# Patient Record
Sex: Male | Born: 1985 | Race: White | Hispanic: No | Marital: Single | Smoking: Current every day smoker
Health system: Southern US, Community
[De-identification: ages and names within clinical notes are randomized; demographics above are authoritative.]

## PROBLEM LIST (undated history)

## (undated) DIAGNOSIS — S5290XA Unspecified fracture of unspecified forearm, initial encounter for closed fracture: Secondary | ICD-10-CM

## (undated) DIAGNOSIS — S62631A Displaced fracture of distal phalanx of left index finger, initial encounter for closed fracture: Secondary | ICD-10-CM

## (undated) DIAGNOSIS — M5430 Sciatica, unspecified side: Secondary | ICD-10-CM

## (undated) DIAGNOSIS — R21 Rash and other nonspecific skin eruption: Secondary | ICD-10-CM

---
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
## 2010-01-10 ENCOUNTER — Encounter: Admission: RE | Admit: 2010-01-10 | Discharge: 2010-01-10 | Payer: Self-pay | Admitting: Orthopaedic Surgery

## 2010-12-11 ENCOUNTER — Emergency Department (HOSPITAL_COMMUNITY)
Admission: EM | Admit: 2010-12-11 | Discharge: 2010-12-11 | Disposition: A | Discharge status: Home / Self Care | Payer: Self-pay | Attending: Emergency Medicine | Admitting: Emergency Medicine

## 2010-12-11 DIAGNOSIS — S335XXA Sprain of ligaments of lumbar spine, initial encounter: Secondary | ICD-10-CM | POA: Insufficient documentation

## 2010-12-11 DIAGNOSIS — X500XXA Overexertion from strenuous movement or load, initial encounter: Secondary | ICD-10-CM | POA: Insufficient documentation

## 2010-12-11 DIAGNOSIS — M545 Low back pain, unspecified: Secondary | ICD-10-CM | POA: Insufficient documentation

## 2015-06-09 ENCOUNTER — Encounter (HOSPITAL_COMMUNITY): Payer: Self-pay

## 2015-06-09 ENCOUNTER — Emergency Department (HOSPITAL_COMMUNITY)
Admission: EM | Admit: 2015-06-09 | Discharge: 2015-06-09 | Disposition: A | Discharge status: Home / Self Care | Payer: Self-pay | Attending: Emergency Medicine | Admitting: Emergency Medicine

## 2015-06-09 DIAGNOSIS — Z720 Tobacco use: Secondary | ICD-10-CM | POA: Insufficient documentation

## 2015-06-09 DIAGNOSIS — M5416 Radiculopathy, lumbar region: Secondary | ICD-10-CM | POA: Insufficient documentation

## 2015-06-09 HISTORY — DX: Sciatica, unspecified side: M54.30

## 2015-06-09 MED ORDER — CYCLOBENZAPRINE HCL 5 MG PO TABS: 5.0000 mg | ORAL_TABLET | Freq: Three times a day (TID) | ORAL | Status: DC | PRN

## 2015-06-09 MED ORDER — PREDNISONE 10 MG PO TABS: ORAL_TABLET | ORAL | Status: DC

## 2015-06-09 NOTE — ED Notes (Signed)
Discharge instructions given to pt -- reviewed presriptions and the possible side effects of sleepiness . Verbalized understanding , Ambulated off unit independently

## 2015-06-09 NOTE — ED Notes (Signed)
Pt reports has history of sciatica.  Reports pain in r lower back radiating down r leg after cutting down a tree Saturday.

## 2015-06-09 NOTE — Discharge Instructions (Signed)
Lumbosacral Radiculopathy Lumbosacral radiculopathy is a pinched nerve or nerves in the low back (lumbosacral area). When this happens you may have weakness in your legs and may not be able to stand on your toes. You may have pain going down into your legs. There may be difficulties with walking normally. There are many causes of this problem. Sometimes this may happen from an injury, or simply from arthritis or boney problems. It may also be caused by other illnesses such as diabetes. If there is no improvement after treatment, further studies may be done to find the exact cause. DIAGNOSIS  X-rays may be needed if the problems become long standing. Electromyograms may be done. This study is one in which the working of nerves and muscles is studied. HOME CARE INSTRUCTIONS   Applications of ice packs may be helpful. Ice can be used in a plastic bag with a towel around it to prevent frostbite to skin. This may be used every 2 hours for 20 to 30 minutes, or as needed, while awake, or as directed by your caregiver.  Only take over-the-counter or prescription medicines for pain, discomfort, or fever as directed by your caregiver.  If physical therapy was prescribed, follow your caregiver's directions. SEEK IMMEDIATE MEDICAL CARE IF:   You have pain not controlled with medications.  You seem to be getting worse rather than better.  You develop increasing weakness in your legs.  You develop loss of bowel or bladder control.  You have difficulty with walking or balance, or develop clumsiness in the use of your legs.  You have a fever. MAKE SURE YOU:   Understand these instructions.  Will watch your condition.  Will get help right away if you are not doing well or get worse. Document Released: 08/28/2005 Document Revised: 11/20/2011 Document Reviewed: 04/17/2008 [facility] Patient Information 2015 [place], [place]. This information is not intended to replace advice given to you by your health  care provider. Make sure you discuss any questions you have with your health care provider.    Avoid lifting,  Bending,  Twisting or any other activity that worsens your pain over the next week.  Apply a heating pad to your lower back for 20 minutes three times daily.  You should get rechecked if your symptoms are not better over the next 5 days,  Or you develop increased pain,  Weakness in your leg(s) or loss of bladder or bowel function - these are symptoms of a worsening problem.

## 2015-06-10 NOTE — ED Provider Notes (Signed)
CSN: [id number]     Arrival date & time 06/09/15  1037 History   First MD Initiated Contact with Patient 06/09/15 1052     Chief Complaint  Patient presents with  . Back Pain     (Consider location/radiation/quality/duration/timing/severity/associated sxs/prior Treatment) The history is provided by the patient.   Luke Boone is a 29 y.o. male presenting with acute on chronic low back pain which has which has been present for the past 2 days since he assisted at home cleaning up a tree which was cut several days ago.  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  He was receiving steroid injections by Dr. Cleophas Dunker but stopped going when this tx was not working and surgery was suggested.  He reports radiation of pain into his right posterior thigh.  There has been no weakness or numbness in the lower extremities and no urinary or bowel retention or incontinence.  Patient does not have a history of cancer or IVDU.  The patient has tried rest ice and tylenol without significant relief of symptoms.     Past Medical History  Diagnosis Date  . Sciatica    History reviewed. No pertinent past surgical history. No family history on file. Social History  Substance Use Topics  . Smoking status: Current Every Day Smoker  . Smokeless tobacco: None  . Alcohol Use: No    Review of Systems  Constitutional: Negative for fever.  Respiratory: Negative for shortness of breath.   Cardiovascular: Negative for chest pain and leg swelling.  Gastrointestinal: Negative for abdominal pain, constipation and abdominal distention.  Genitourinary: Negative for dysuria, urgency, frequency, flank pain and difficulty urinating.  Musculoskeletal: Positive for back pain. Negative for joint swelling and gait problem.  Skin: Negative for rash.  Neurological: Negative for weakness and numbness.      Allergies  Review of patient's allergies indicates no known  allergies.  Home Medications   Prior to Admission medications   Medication Sig Start Date End Date Taking? Authorizing Provider  cyclobenzaprine (FLEXERIL) 5 MG tablet Take 1 tablet (5 mg total) by mouth 3 (three) times daily as needed for muscle spasms. 06/09/15   Burgess Amor, PA-C  predniSONE (DELTASONE) 10 MG tablet 6, 5, 4, 3, 2 then 1 tablet by mouth daily for 6 days total. 06/09/15   Burgess Amor, PA-C   BP 154/99 mmHg  Pulse 91  Temp(Src) 97.8 F (36.6 C) (Oral)  Resp 18  Ht  (1.778 m)  Wt 130 lb (58.968 kg)  BMI 18.65 kg/m2  SpO2 100% Physical Exam  Constitutional: He appears well-developed and well-nourished.  HENT:  Head: Normocephalic.  Eyes: Conjunctivae are normal.  Neck: Normal range of motion. Neck supple.  Cardiovascular: Normal rate and intact distal pulses.   Pedal pulses normal.  Pulmonary/Chest: Effort normal.  Abdominal: Soft. Bowel sounds are normal. He exhibits no distension and no mass.  Musculoskeletal: Normal range of motion. He exhibits no edema.       Lumbar back: He exhibits tenderness. He exhibits no swelling, no edema and no spasm.  Right paralumbar ttp.   Neurological: He is alert. He has normal strength. He displays no atrophy and no tremor. No sensory deficit. Gait normal.  Reflex Scores:      Patellar reflexes are 2+ on the right side and 2+ on the left side.      Achilles reflexes are 2+ on the right side and 2+ on the  left side. No strength deficit noted in hip and knee flexor and extensor muscle groups.  Ankle flexion and extension intact.  Skin: Skin is warm and dry.  Psychiatric: He has a normal mood and affect.  Nursing note and vitals reviewed.   ED Course  Procedures (including critical care time) Labs Review Labs Reviewed - No data to display  Imaging Review No results found. I have personally reviewed and evaluated these images and lab results as part of my medical decision-making.   EKG Interpretation None      MDM    Final diagnoses:  Lumbar radiculopathy, acute    No neuro deficit on exam or by history to suggest emergent or surgical presentation with no indication for imaging.  Discussed worsened sx that should prompt immediate re-evaluation including distal weakness, bowel/bladder retention/incontinence.  He was placed on prednisone taper, and flexeril, advised heat tx alternating with ice.  Activity as tolerated, f/u with orthopedic specialist if sx persist or worsen.          Burgess Amor, PA-C 06/10/15 1610  Blane Ohara, MD 06/15/15 [phone]

## 2016-07-16 ENCOUNTER — Encounter (HOSPITAL_COMMUNITY): Payer: Self-pay

## 2016-07-16 ENCOUNTER — Ambulatory Visit (HOSPITAL_COMMUNITY)
Admission: EM | Admit: 2016-07-16 | Discharge: 2016-07-16 | Disposition: A | Discharge status: Home / Self Care | Payer: Self-pay | Attending: Emergency Medicine | Admitting: Emergency Medicine

## 2016-07-16 ENCOUNTER — Ambulatory Visit (INDEPENDENT_AMBULATORY_CARE_PROVIDER_SITE_OTHER): Payer: Self-pay

## 2016-07-16 DIAGNOSIS — S42021A Displaced fracture of shaft of right clavicle, initial encounter for closed fracture: Secondary | ICD-10-CM

## 2016-07-16 MED ORDER — HYDROCODONE-ACETAMINOPHEN 7.5-325 MG PO TABS: 1.0000 | ORAL_TABLET | ORAL | 0 refills | Status: DC | PRN
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
## 2016-07-16 NOTE — Discharge Instructions (Signed)
Ice, wear the arm sling until you see the orthopedist. Call the orthopedist tomorrow for an appointment. Decrease her activity. If you develop shortness of breath or cough, weakness, faintness, passing out or other problems call 911 or go to the emergency department promptly.

## 2017-03-07 ENCOUNTER — Emergency Department (HOSPITAL_COMMUNITY): Payer: Self-pay

## 2017-03-07 ENCOUNTER — Encounter (HOSPITAL_COMMUNITY): Payer: Self-pay | Admitting: Family Medicine

## 2017-03-07 ENCOUNTER — Emergency Department (HOSPITAL_COMMUNITY)
Admission: EM | Admit: 2017-03-07 | Discharge: 2017-03-08 | Disposition: A | Discharge status: Home / Self Care | Payer: Self-pay | Attending: Emergency Medicine | Admitting: Emergency Medicine

## 2017-03-07 DIAGNOSIS — Y92410 Unspecified street and highway as the place of occurrence of the external cause: Secondary | ICD-10-CM | POA: Insufficient documentation

## 2017-03-07 DIAGNOSIS — Y9389 Activity, other specified: Secondary | ICD-10-CM | POA: Insufficient documentation

## 2017-03-07 DIAGNOSIS — S62631A Displaced fracture of distal phalanx of left index finger, initial encounter for closed fracture: Secondary | ICD-10-CM | POA: Insufficient documentation

## 2017-03-07 DIAGNOSIS — Z79899 Other long term (current) drug therapy: Secondary | ICD-10-CM | POA: Insufficient documentation

## 2017-03-07 DIAGNOSIS — S52614A Nondisplaced fracture of right ulna styloid process, initial encounter for closed fracture: Secondary | ICD-10-CM | POA: Insufficient documentation

## 2017-03-07 DIAGNOSIS — Y999 Unspecified external cause status: Secondary | ICD-10-CM | POA: Insufficient documentation

## 2017-03-07 DIAGNOSIS — F17210 Nicotine dependence, cigarettes, uncomplicated: Secondary | ICD-10-CM | POA: Insufficient documentation

## 2017-03-07 DIAGNOSIS — S0181XA Laceration without foreign body of other part of head, initial encounter: Secondary | ICD-10-CM | POA: Insufficient documentation

## 2017-03-07 DIAGNOSIS — S52571A Other intraarticular fracture of lower end of right radius, initial encounter for closed fracture: Secondary | ICD-10-CM | POA: Insufficient documentation

## 2017-03-07 DIAGNOSIS — S5290XA Unspecified fracture of unspecified forearm, initial encounter for closed fracture: Secondary | ICD-10-CM

## 2017-03-07 DIAGNOSIS — S30810A Abrasion of lower back and pelvis, initial encounter: Secondary | ICD-10-CM | POA: Insufficient documentation

## 2017-03-07 DIAGNOSIS — S62611A Displaced fracture of proximal phalanx of left index finger, initial encounter for closed fracture: Secondary | ICD-10-CM | POA: Diagnosis present

## 2017-03-07 DIAGNOSIS — Z23 Encounter for immunization: Secondary | ICD-10-CM | POA: Insufficient documentation

## 2017-03-07 HISTORY — DX: Unspecified fracture of unspecified forearm, initial encounter for closed fracture: S52.90XA

## 2017-03-07 MED ORDER — LIDOCAINE HCL (PF) 1 % IJ SOLN
5.0000 mL | Freq: Once | INTRAMUSCULAR | Status: AC
  Administered 2017-03-07: 5 mL
  Filled 2017-03-07: qty 30

## 2017-03-07 MED ORDER — BACITRACIN ZINC 500 UNIT/GM EX OINT
TOPICAL_OINTMENT | Freq: Two times a day (BID) | CUTANEOUS | Status: DC
  Administered 2017-03-07: 3 via TOPICAL
  Filled 2017-03-07: qty 2.7

## 2017-03-07 MED ORDER — TETANUS-DIPHTH-ACELL PERTUSSIS 5-2.5-18.5 LF-MCG/0.5 IM SUSP
0.5000 mL | Freq: Once | INTRAMUSCULAR | Status: AC
  Administered 2017-03-07: 0.5 mL via INTRAMUSCULAR
  Filled 2017-03-07: qty 0.5

## 2017-03-07 MED ORDER — HYDROCODONE-ACETAMINOPHEN 5-325 MG PO TABS
1.0000 | ORAL_TABLET | Freq: Once | ORAL | Status: AC
  Administered 2017-03-07: 1 via ORAL
  Filled 2017-03-07: qty 1
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
## 2017-03-08 ENCOUNTER — Other Ambulatory Visit (INDEPENDENT_AMBULATORY_CARE_PROVIDER_SITE_OTHER): Payer: Self-pay | Admitting: Orthopaedic Surgery

## 2017-03-08 ENCOUNTER — Ambulatory Visit (INDEPENDENT_AMBULATORY_CARE_PROVIDER_SITE_OTHER): Payer: Self-pay | Admitting: Orthopaedic Surgery

## 2017-03-08 DIAGNOSIS — S62611A Displaced fracture of proximal phalanx of left index finger, initial encounter for closed fracture: Secondary | ICD-10-CM

## 2017-03-08 DIAGNOSIS — S52531A Colles' fracture of right radius, initial encounter for closed fracture: Secondary | ICD-10-CM

## 2017-03-08 MED ORDER — HYDROCODONE-ACETAMINOPHEN 5-325 MG PO TABS: 1.0000 | ORAL_TABLET | Freq: Four times a day (QID) | ORAL | 0 refills | Status: DC | PRN

## 2017-03-08 MED ORDER — METHOCARBAMOL 500 MG PO TABS: 500.0000 mg | ORAL_TABLET | Freq: Every evening | ORAL | 0 refills | Status: DC | PRN

## 2017-03-08 MED ORDER — NAPROXEN 500 MG PO TABS: 500.0000 mg | ORAL_TABLET | Freq: Two times a day (BID) | ORAL | 0 refills | Status: DC
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
## 2017-03-08 NOTE — Progress Notes (Signed)
Orthopedic Tech Progress Note Patient Details:  Luke Boone 11/13/1985 [id number]  Ortho Devices Type of Ortho Device: Arm sling, Sugartong splint, Finger splint Ortho Device/Splint Location: lue index finger splint. rue sugartong. Ortho Device/Splint Interventions: Ordered, Application, Adjustment   Luke Boone, Luke Boone 03/08/2017, 1:31 AM

## 2017-03-08 NOTE — ED Notes (Signed)
Ortho tech at bedside 
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
## 2017-03-12 ENCOUNTER — Other Ambulatory Visit (INDEPENDENT_AMBULATORY_CARE_PROVIDER_SITE_OTHER): Payer: Self-pay | Admitting: Orthopaedic Surgery

## 2017-03-12 DIAGNOSIS — S52501A Unspecified fracture of the lower end of right radius, initial encounter for closed fracture: Secondary | ICD-10-CM

## 2017-03-13 ENCOUNTER — Encounter (HOSPITAL_COMMUNITY): Payer: Self-pay

## 2017-03-13 ENCOUNTER — Encounter (HOSPITAL_COMMUNITY)
Admission: RE | Admit: 2017-03-13 | Discharge: 2017-03-13 | Disposition: A | Discharge status: Home / Self Care | Payer: Self-pay | Source: Ambulatory Visit | Attending: Orthopaedic Surgery | Admitting: Orthopaedic Surgery

## 2017-03-13 DIAGNOSIS — S52501A Unspecified fracture of the lower end of right radius, initial encounter for closed fracture: Secondary | ICD-10-CM | POA: Insufficient documentation

## 2017-03-13 DIAGNOSIS — X58XXXA Exposure to other specified factors, initial encounter: Secondary | ICD-10-CM | POA: Insufficient documentation

## 2017-03-13 DIAGNOSIS — S62611A Displaced fracture of proximal phalanx of left index finger, initial encounter for closed fracture: Secondary | ICD-10-CM | POA: Insufficient documentation

## 2017-03-13 DIAGNOSIS — Z01812 Encounter for preprocedural laboratory examination: Secondary | ICD-10-CM | POA: Insufficient documentation

## 2017-03-13 HISTORY — DX: Displaced fracture of distal phalanx of left index finger, initial encounter for closed fracture: S62.631A

## 2017-03-13 HISTORY — DX: Rash and other nonspecific skin eruption: R21

## 2017-03-13 HISTORY — DX: Unspecified fracture of unspecified forearm, initial encounter for closed fracture: S52.90XA

## 2017-03-13 LAB — CBC
HCT: 42.1 % (ref 39.0–52.0)
Hemoglobin: 14.3 g/dL (ref 13.0–17.0)
MCH: 30.7 pg (ref 26.0–34.0)
MCHC: 34 g/dL (ref 30.0–36.0)
MCV: 90.3 fL (ref 78.0–100.0)
Platelets: 267 10*3/uL (ref 150–400)
RBC: 4.66 MIL/uL (ref 4.22–5.81)
RDW: 12.7 % (ref 11.5–15.5)
WBC: 12.8 10*3/uL — AB (ref 4.0–10.5)

## 2017-03-13 NOTE — Patient Instructions (Addendum)
Sharren BridgeCory D Kohles  03/13/2017   Your procedure is scheduled on: 03-16-17  [facility]  Entrance [facility] [place]  elevators to 3rd floor to  Short Stay Center at 715  AM.  Call this number if you have problems the morning of surgery [phone]    Remember: ONLY 1 PERSON MAY GO WITH YOU TO SHORT STAY TO GET  READY MORNING OF YOUR SURGERY.  Do not eat food or drink liquids :After Midnight.     Take these medicines the morning of surgery with A SIP OF WATER: hydrocodone if needed                               You may not have any metal on your body including hair pins and              piercings  Do not wear jewelry, make-up, lotions, powders or perfumes, deodorant             Do not wear nail polish.  Do not shave  48 hours prior to surgery.              Men may shave face and neck.   Do not bring valuables to the hospital. [facility] IS NOT             RESPONSIBLE   FOR VALUABLES.  Contacts, dentures or bridgework may not be worn into surgery.  Leave suitcase in the car. After surgery it may be brought to your room.                  Please read over the following fact sheets you were given: _____________________________________________________________________             [facility] - Preparing for [facility] Before surgery, you can play an important role.  Because skin is not sterile, your skin needs to be as free of germs as possible.  You can reduce the number of germs on your skin by washing with CHG (chlorahexidine gluconate) soap before surgery.  CHG is an antiseptic cleaner which kills germs and bonds with the skin to continue killing germs even after washing. Please DO NOT use if you have an allergy to CHG or antibacterial soaps.  If your skin becomes reddened/irritated stop using the CHG and inform your nurse when you arrive at Short Stay. Do not shave (including legs and underarms) for at least 48 hours prior to the first CHG shower.  You may  shave your face/neck. Please follow these instructions carefully:  1.  Shower with CHG Soap the night before surgery and the  morning of Surgery.  2.  If you choose to wash your hair, wash your hair first as usual with your  normal  shampoo.  3.  After you shampoo, rinse your hair and body thoroughly to remove the  shampoo.                           4.  Use CHG as you would any other liquid soap.  You can apply chg directly  to the skin and wash                       Gently with a scrungie or clean washcloth.  5.  Apply  the CHG Soap to your body ONLY FROM THE NECK DOWN.   Do not use on face/ open                           Wound or open sores. Avoid contact with eyes, ears mouth and genitals (private parts).                       Wash face,  Genitals (private parts) with your normal soap.             6.  Wash thoroughly, paying special attention to the area where your surgery  will be performed.  7.  Thoroughly rinse your body with warm water from the neck down.  8.  DO NOT shower/wash with your normal soap after using and rinsing off  the CHG Soap.                9.  Pat yourself dry with a clean towel.            10.  Wear clean pajamas.            11.  Place clean sheets on your bed the night of your first shower and do not  sleep with pets. Day of Surgery : Do not apply any lotions/deodorants the morning of surgery.  Please wear clean clothes to the hospital/surgery center.  FAILURE TO FOLLOW THESE INSTRUCTIONS MAY RESULT IN THE CANCELLATION OF YOUR SURGERY PATIENT SIGNATURE_________________________________  NURSE SIGNATURE__________________________________  ________________________________________________________________________
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
## 2017-03-16 ENCOUNTER — Ambulatory Visit (HOSPITAL_COMMUNITY)
Admission: RE | Admit: 2017-03-16 | Discharge: 2017-03-16 | Disposition: A | Discharge status: Home / Self Care | Payer: Self-pay | Source: Ambulatory Visit | Attending: Orthopaedic Surgery | Admitting: Orthopaedic Surgery

## 2017-03-16 ENCOUNTER — Ambulatory Visit (HOSPITAL_COMMUNITY): Payer: Self-pay | Admitting: Anesthesiology

## 2017-03-16 ENCOUNTER — Encounter (HOSPITAL_COMMUNITY): Payer: Self-pay | Admitting: Anesthesiology

## 2017-03-16 ENCOUNTER — Encounter (HOSPITAL_COMMUNITY)
Admission: RE | Disposition: A | Discharge status: Home / Self Care | Payer: Self-pay | Source: Ambulatory Visit | Attending: Orthopaedic Surgery

## 2017-03-16 DIAGNOSIS — S62611A Displaced fracture of proximal phalanx of left index finger, initial encounter for closed fracture: Secondary | ICD-10-CM | POA: Diagnosis present

## 2017-03-16 DIAGNOSIS — S52531A Colles' fracture of right radius, initial encounter for closed fracture: Secondary | ICD-10-CM

## 2017-03-16 DIAGNOSIS — F17210 Nicotine dependence, cigarettes, uncomplicated: Secondary | ICD-10-CM | POA: Insufficient documentation

## 2017-03-16 DIAGNOSIS — S52571A Other intraarticular fracture of lower end of right radius, initial encounter for closed fracture: Secondary | ICD-10-CM | POA: Insufficient documentation

## 2017-03-16 DIAGNOSIS — S52501A Unspecified fracture of the lower end of right radius, initial encounter for closed fracture: Secondary | ICD-10-CM

## 2017-03-16 HISTORY — PX: OPEN REDUCTION INTERNAL FIXATION (ORIF) DISTAL RADIAL FRACTURE: SHX5989

## 2017-03-16 SURGERY — OPEN REDUCTION INTERNAL FIXATION (ORIF) DISTAL RADIUS FRACTURE
Anesthesia: Monitor Anesthesia Care | Site: Wrist | Laterality: Right

## 2017-03-16 MED ORDER — PROPOFOL 10 MG/ML IV BOLUS
INTRAVENOUS | Status: AC
  Filled 2017-03-16: qty 20

## 2017-03-16 MED ORDER — ROPIVACAINE HCL 5 MG/ML IJ SOLN
INTRAMUSCULAR | Status: DC | PRN
  Administered 2017-03-16: 5 mL via PERINEURAL

## 2017-03-16 MED ORDER — BACITRACIN-NEOMYCIN-POLYMYXIN 400-5-5000 EX OINT
TOPICAL_OINTMENT | CUTANEOUS | Status: AC
  Filled 2017-03-16: qty 1

## 2017-03-16 MED ORDER — PROPOFOL 10 MG/ML IV BOLUS
INTRAVENOUS | Status: DC | PRN
  Administered 2017-03-16 (×2): 20 mg via INTRAVENOUS
  Administered 2017-03-16: 40 mg via INTRAVENOUS

## 2017-03-16 MED ORDER — SODIUM CHLORIDE 0.9 % IV SOLN
INTRAVENOUS | Status: DC | PRN
  Administered 2017-03-16: 500 mL

## 2017-03-16 MED ORDER — OXYCODONE-ACETAMINOPHEN 5-325 MG PO TABS: 1.0000 | ORAL_TABLET | ORAL | 0 refills | Status: DC | PRN

## 2017-03-16 MED ORDER — PHENYLEPHRINE HCL 10 MG/ML IJ SOLN
INTRAMUSCULAR | Status: DC | PRN
  Administered 2017-03-16 (×2): 40 ug via INTRAVENOUS

## 2017-03-16 MED ORDER — SODIUM CHLORIDE 0.9 % IV SOLN
INTRAVENOUS | Status: AC
  Filled 2017-03-16: qty 500000

## 2017-03-16 MED ORDER — FENTANYL CITRATE (PF) 250 MCG/5ML IJ SOLN
INTRAMUSCULAR | Status: AC
  Filled 2017-03-16: qty 5

## 2017-03-16 MED ORDER — FENTANYL CITRATE (PF) 100 MCG/2ML IJ SOLN: 25.0000 ug | INTRAMUSCULAR | Status: DC | PRN

## 2017-03-16 MED ORDER — ONDANSETRON HCL 4 MG/2ML IJ SOLN
INTRAMUSCULAR | Status: DC | PRN
  Administered 2017-03-16: 4 mg via INTRAVENOUS

## 2017-03-16 MED ORDER — PROPOFOL 10 MG/ML IV BOLUS
INTRAVENOUS | Status: AC
  Filled 2017-03-16: qty 40

## 2017-03-16 MED ORDER — FENTANYL CITRATE (PF) 100 MCG/2ML IJ SOLN
INTRAMUSCULAR | Status: DC | PRN
Start: 2017-03-16 — End: 2017-03-16
  Administered 2017-03-16 (×3): 25 ug via INTRAVENOUS
  Administered 2017-03-16: 50 ug via INTRAVENOUS

## 2017-03-16 MED ORDER — BACITRACIN-NEOMYCIN-POLYMYXIN 400-5-5000 EX OINT
TOPICAL_OINTMENT | CUTANEOUS | Status: DC | PRN
  Administered 2017-03-16: 1 via TOPICAL

## 2017-03-16 MED ORDER — BUPIVACAINE-EPINEPHRINE (PF) 0.5% -1:200000 IJ SOLN
INTRAMUSCULAR | Status: DC | PRN
  Administered 2017-03-16: 20 mL via PERINEURAL

## 2017-03-16 MED ORDER — ONDANSETRON HCL 4 MG/2ML IJ SOLN
INTRAMUSCULAR | Status: AC
  Filled 2017-03-16: qty 2

## 2017-03-16 MED ORDER — CEFAZOLIN SODIUM-DEXTROSE 2-4 GM/100ML-% IV SOLN
2.0000 g | INTRAVENOUS | Status: AC
  Administered 2017-03-16: 2 g via INTRAVENOUS
  Filled 2017-03-16: qty 100

## 2017-03-16 MED ORDER — ACETAMINOPHEN 325 MG PO TABS: 325.0000 mg | ORAL_TABLET | ORAL | Status: DC | PRN

## 2017-03-16 MED ORDER — MIDAZOLAM HCL 2 MG/2ML IJ SOLN
INTRAMUSCULAR | Status: AC
  Filled 2017-03-16: qty 2

## 2017-03-16 MED ORDER — PROPOFOL 500 MG/50ML IV EMUL
INTRAVENOUS | Status: DC | PRN
  Administered 2017-03-16: 100 ug/kg/min via INTRAVENOUS

## 2017-03-16 MED ORDER — LACTATED RINGERS IV SOLN
INTRAVENOUS | Status: DC
  Administered 2017-03-16: 08:00:00 via INTRAVENOUS

## 2017-03-16 MED ORDER — PHENYLEPHRINE 40 MCG/ML (10ML) SYRINGE FOR IV PUSH (FOR BLOOD PRESSURE SUPPORT)
PREFILLED_SYRINGE | INTRAVENOUS | Status: AC
  Filled 2017-03-16: qty 10

## 2017-03-16 MED ORDER — MIDAZOLAM HCL 5 MG/5ML IJ SOLN
INTRAMUSCULAR | Status: DC | PRN
Start: 2017-03-16 — End: 2017-03-16
  Administered 2017-03-16: 2 mg via INTRAVENOUS

## 2017-03-16 MED ORDER — ACETAMINOPHEN 160 MG/5ML PO SOLN: 325.0000 mg | ORAL | Status: DC | PRN

## 2017-03-16 SURGICAL SUPPLY — 71 items
BAG SPEC THK2 15X12 ZIP CLS (MISCELLANEOUS) ×2
BAG ZIPLOCK 12X15 (MISCELLANEOUS) ×4 IMPLANT
BANDAGE ACE 4X5 VEL STRL LF (GAUZE/BANDAGES/DRESSINGS) ×4 IMPLANT
BIT DRILL 2.2 SS TIBIAL (BIT) ×3 IMPLANT
COVER SURGICAL LIGHT HANDLE (MISCELLANEOUS) ×4 IMPLANT
CUFF TOURN SGL QUICK 18 (TOURNIQUET CUFF) ×4 IMPLANT
DRAPE C-ARM 42X120 X-RAY (DRAPES) ×4 IMPLANT
DRAPE OEC MINIVIEW 54X84 (DRAPES) ×4 IMPLANT
DRAPE SHEET LG 3/4 BI-LAMINATE (DRAPES) ×4 IMPLANT
DRAPE SURG 17X11 SM STRL (DRAPES) ×4 IMPLANT
DRAPE U-SHAPE 47X51 STRL (DRAPES) ×4 IMPLANT
DRSG PAD ABDOMINAL 8X10 ST (GAUZE/BANDAGES/DRESSINGS) ×4 IMPLANT
DURAPREP 26ML APPLICATOR (WOUND CARE) ×4 IMPLANT
ELECT REM PT RETURN 15FT ADLT (MISCELLANEOUS) ×4 IMPLANT
GAUZE SPONGE 4X4 12PLY STRL (GAUZE/BANDAGES/DRESSINGS) ×4 IMPLANT
GAUZE XEROFORM 1X8 LF (GAUZE/BANDAGES/DRESSINGS) ×3 IMPLANT
GLOVE BIO SURGEON STRL SZ7.5 (GLOVE) ×4 IMPLANT
GLOVE BIOGEL PI IND STRL 7.5 (GLOVE) ×3 IMPLANT
GLOVE BIOGEL PI IND STRL 8 (GLOVE) ×6 IMPLANT
GLOVE BIOGEL PI INDICATOR 7.5 (GLOVE) ×6
GLOVE BIOGEL PI INDICATOR 8 (GLOVE) ×6
GLOVE ECLIPSE 8.0 STRL XLNG CF (GLOVE) ×7 IMPLANT
GLOVE ECLIPSE 8.5 STRL (GLOVE) ×3 IMPLANT
GLOVE ORTHO TXT STRL SZ7.5 (GLOVE) ×4 IMPLANT
GLOVE SURG SS PI 7.5 STRL IVOR (GLOVE) ×3 IMPLANT
GOWN STRL REUS W/ TWL XL LVL3 (GOWN DISPOSABLE) ×3 IMPLANT
GOWN STRL REUS W/TWL XL LVL3 (GOWN DISPOSABLE) ×12 IMPLANT
K-WIRE 1.6 (WIRE) ×12
K-WIRE CAPS BLUE STERILE .035 (WIRE)
K-WIRE CAPS STERILE WHITE .045 (WIRE) IMPLANT
K-WIRE FX5X1.6XNS BN SS (WIRE) ×6
KIT BASIN OR (CUSTOM PROCEDURE TRAY) ×4 IMPLANT
KWIRE 4.0 X .045IN (WIRE) IMPLANT
KWIRE CAPS BLUE STRL .035 (WIRE) IMPLANT
KWIRE FX5X1.6XNS BN SS (WIRE) ×3 IMPLANT
MANIFOLD NEPTUNE II (INSTRUMENTS) ×4 IMPLANT
NEEDLE HYPO 22GX1.5 SAFETY (NEEDLE) ×3 IMPLANT
PACK ORTHO EXTREMITY (CUSTOM PROCEDURE TRAY) ×4 IMPLANT
PAD ABD 8X10 STRL (GAUZE/BANDAGES/DRESSINGS) ×3 IMPLANT
PAD CAST 3X4 CTTN HI CHSV (CAST SUPPLIES) ×2 IMPLANT
PAD CAST 4YDX4 CTTN HI CHSV (CAST SUPPLIES) ×2 IMPLANT
PADDING CAST COTTON 3X4 STRL (CAST SUPPLIES) ×4
PADDING CAST COTTON 4X4 STRL (CAST SUPPLIES) ×4
PEG LOCKING SMOOTH 2.2X12 (Peg) ×3 IMPLANT
PEG LOCKING SMOOTH 2.2X18 (Peg) ×3 IMPLANT
PEG LOCKING SMOOTH 2.2X20 (Screw) ×3 IMPLANT
PLATE NARROW DVR RIGHT (Plate) ×3 IMPLANT
POSITIONER SURGICAL ARM (MISCELLANEOUS) ×4 IMPLANT
SCREW LOCK 14X2.7X 3 LD TPR (Screw) ×2 IMPLANT
SCREW LOCK 20X2.7X 3 LD TPR (Screw) ×1 IMPLANT
SCREW LOCKING 2.7X14 (Screw) ×8 IMPLANT
SCREW LOCKING 2.7X15MM (Screw) ×6 IMPLANT
SCREW LOCKING 2.7X20MM (Screw) ×4 IMPLANT
SCREW MULTI DIRECTIONAL 2.7X18 (Screw) ×3 IMPLANT
SCREW MULTI DIRECTIONAL 2.7X20 (Screw) ×3 IMPLANT
SCREW NONLOCK 2.7X20MM (Screw) ×3 IMPLANT
SOL PREP POV-IOD 4OZ 10% (MISCELLANEOUS) ×4 IMPLANT
SUCTION FRAZIER HANDLE 10FR (MISCELLANEOUS) ×2
SUCTION TUBE FRAZIER 10FR DISP (MISCELLANEOUS) ×1 IMPLANT
SUT ETHILON 3 0 PS 1 (SUTURE) ×7 IMPLANT
SUT MERSILENE 4 0 P 3 (SUTURE) ×1 IMPLANT
SUT MNCRL AB 4-0 PS2 18 (SUTURE) ×4 IMPLANT
SUT PROLENE 3 0 PS 2 (SUTURE) ×1 IMPLANT
SUT VIC AB 0 CT1 27 (SUTURE)
SUT VIC AB 0 CT1 27XBRD ANTBC (SUTURE) ×2 IMPLANT
SUT VIC AB 2-0 CT1 27 (SUTURE) ×4
SUT VIC AB 2-0 CT1 TAPERPNT 27 (SUTURE) ×2 IMPLANT
SYR CONTROL 10ML LL (SYRINGE) IMPLANT
TOWEL OR 17X26 10 PK STRL BLUE (TOWEL DISPOSABLE) ×8 IMPLANT
TOWEL OR NON WOVEN STRL DISP B (DISPOSABLE) ×4 IMPLANT
UNDERPAD 30X30 (UNDERPADS AND DIAPERS) ×4 IMPLANT

## 2017-03-16 NOTE — Transfer of Care (Signed)
Immediate Anesthesia Transfer of Care Note  Patient: Luke Boone  Procedure(s) Performed: Procedure(s): OPEN REDUCTION INTERNAL FIXATION (ORIF) RIGHT DISTAL RADIUS FRACTURE (Right)  Patient Location: PACU  Anesthesia Type:MAC combined with regional for post-op pain  Level of Consciousness: awake, alert , oriented and patient cooperative  Airway & Oxygen Therapy: Patient Spontanous Breathing and Patient connected to nasal cannula oxygen  Post-op Assessment: Report given to RN and Post -op Vital signs reviewed and stable  Post vital signs: Reviewed and stable  Last Vitals:  Vitals:   03/16/17 0904 03/16/17 0906  BP:    Pulse: 79 90  Resp: 16 (!) 21  Temp:      Last Pain:  Vitals:   03/16/17 0829  TempSrc: Oral      Patients Stated Pain Goal: 3 (94/07/68 0881)  Complications: No apparent anesthesia complications
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
## 2017-03-16 NOTE — Brief Op Note (Signed)
03/16/2017  10:39 AM  PATIENT:  Luke Boone  31 y.o. male  PRE-OPERATIVE DIAGNOSIS:  right distal radius fracture, left index finger proximal phalanx fracture  POST-OPERATIVE DIAGNOSIS:  right distal radius fracture, left index finger proximal phalanx fracture  PROCEDURE:  Procedure(s): OPEN REDUCTION INTERNAL FIXATION (ORIF) RIGHT DISTAL RADIUS FRACTURE (Right)  SURGEON:  Surgeon(s) and Role:    Kathryne Hitch* Marca Gadsby Y, MD - Primary  PHYSICIAN ASSISTANT: Rexene EdisonGil Clark, PA-C  ANESTHESIA:   regional and IV sedation  COUNTS:  YES  TOURNIQUET:  * Missing tourniquet times found for documented tourniquets in log:  [id number] *  DICTATION: .Other Dictation: Dictation Number [id number]  PLAN OF CARE: Discharge to home after PACU  PATIENT DISPOSITION:  PACU - hemodynamically stable.   Delay start of Pharmacological VTE agent (>24hrs) due to surgical blood loss or risk of bleeding: no
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
## 2017-03-16 NOTE — Progress Notes (Signed)
Assisted Dr. Moser with right, ultrasound guided, supraclavicular block. Side rails up, monitors on throughout procedure. See vital signs in flow sheet. Tolerated Procedure well. °

## 2017-03-16 NOTE — Anesthesia Procedure Notes (Signed)
Procedure Name: MAC Performed by: Wanita Chamberlain Pre-anesthesia Checklist: Patient identified, Emergency Drugs available, Suction available, Patient being monitored and Timeout performed Patient Re-evaluated:Patient Re-evaluated prior to inductionOxygen Delivery Method: Simple face mask Intubation Type: IV induction Placement Confirmation: CO2 detector and breath sounds checked- equal and bilateral

## 2017-03-16 NOTE — H&P (Signed)
  Luke Boone understands we are proceeding to the OR today for open reduction/internal fixation of his right distal radius fracture only.  He does not wish to have his left index finger operated on and he fully understands the risks involved with non-operative treatment on that injury.  The risks and benefits of fixing his right wrist was discussed and informed consent obtained.
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
## 2017-03-16 NOTE — Anesthesia Procedure Notes (Signed)
Anesthesia Regional Block: Supraclavicular block   Pre-Anesthetic Checklist: ,, timeout performed, Correct Patient, Correct Site, Correct Laterality, Correct Procedure, Correct Position, site marked, Risks and benefits discussed,  Surgical consent,  Pre-op evaluation,  At surgeon's request and post-op pain management  Laterality: Upper and Right  Prep: chloraprep       Needles:  Injection technique: Single-shot  Needle Type: Echogenic Needle          Additional Needles:   Procedures: ultrasound guided,,,,,,,,  Narrative:  Start time: 03/16/2017 8:51 AM End time: 03/16/2017 8:56 AM Injection made incrementally with aspirations every 5 mL.  Performed by: Personally   Additional Notes: H+P and labs reviewed, risks and benefits discussed with patient, procedure tolerated well without complications

## 2017-03-17 ENCOUNTER — Encounter (HOSPITAL_COMMUNITY): Payer: Self-pay | Admitting: Orthopaedic Surgery

## 2017-03-17 NOTE — Anesthesia Postprocedure Evaluation (Addendum)
Anesthesia Post Note  Patient: Luke Boone  Procedure(s) Performed: Procedure(s) (LRB): OPEN REDUCTION INTERNAL FIXATION (ORIF) RIGHT DISTAL RADIUS FRACTURE (Right)     Patient location during evaluation: PACU Anesthesia Type: Regional and MAC Level of consciousness: awake and alert Pain management: pain level controlled Vital Signs Assessment: post-procedure vital signs reviewed and stable Respiratory status: spontaneous breathing, nonlabored ventilation, respiratory function stable and patient connected to nasal cannula oxygen Cardiovascular status: blood pressure returned to baseline and stable Postop Assessment: no signs of nausea or vomiting Anesthetic complications: no    Last Vitals:  Vitals:   03/16/17 1145 03/16/17 1220  BP: 116/83 111/80  Pulse: 68 66  Resp: 14 16  Temp: 36.6 C     Last Pain:  Vitals:   03/16/17 1220  TempSrc:   PainSc: 0-No pain                 My Rinke

## 2017-03-17 NOTE — Addendum Note (Signed)
Addendum  created 03/17/17 1738 by Val EagleMoser, Mariellen Blaney, MD   Sign clinical note, SmartForm saved

## 2017-04-03 ENCOUNTER — Ambulatory Visit (INDEPENDENT_AMBULATORY_CARE_PROVIDER_SITE_OTHER): Payer: Self-pay

## 2017-04-03 ENCOUNTER — Ambulatory Visit (INDEPENDENT_AMBULATORY_CARE_PROVIDER_SITE_OTHER): Payer: Self-pay | Admitting: Orthopaedic Surgery

## 2017-04-03 DIAGNOSIS — M25531 Pain in right wrist: Secondary | ICD-10-CM

## 2017-04-03 NOTE — Progress Notes (Signed)
The patient is now just over 2 weeks status post open reduction internal fixation of severely displaced right distal radius fracture of his dominant wrist. He is been in a Velcro removable wrist splint.  On examination he has significant swelling about his hand he will move his fingers well. He's neurovascularly intact though however the swelling is abundant. His incision looks great and I removed the sutures and place Steri-Strips. His elbow function is normal.  X-rays show intact volar plate of the right distal radius with near anatomic reduction the fracture.  It's essential to get his hand bending and moving as well as his wrist. He'll get a ball that he can squeeze as well. He'll still work on elevation and ice. We'll see him back in 4 weeks to get a repeat AP and lateral of the right wrist. We'll initiate therapy then if needed.

## 2017-05-01 ENCOUNTER — Ambulatory Visit (INDEPENDENT_AMBULATORY_CARE_PROVIDER_SITE_OTHER): Payer: Self-pay | Admitting: Orthopaedic Surgery

## 2017-05-01 ENCOUNTER — Telehealth (INDEPENDENT_AMBULATORY_CARE_PROVIDER_SITE_OTHER): Payer: Self-pay | Admitting: Orthopaedic Surgery

## 2017-05-01 NOTE — Telephone Encounter (Signed)
Returned call to patient left message to call back to reschedule appointment. Patient not feeling well today per voicemail message

## 2017-06-13 ENCOUNTER — Encounter (INDEPENDENT_AMBULATORY_CARE_PROVIDER_SITE_OTHER): Payer: Self-pay | Admitting: Physician Assistant

## 2017-06-13 ENCOUNTER — Ambulatory Visit (INDEPENDENT_AMBULATORY_CARE_PROVIDER_SITE_OTHER): Payer: Self-pay | Admitting: Physician Assistant

## 2017-06-13 ENCOUNTER — Ambulatory Visit (INDEPENDENT_AMBULATORY_CARE_PROVIDER_SITE_OTHER): Payer: Self-pay

## 2017-06-13 DIAGNOSIS — S52531A Colles' fracture of right radius, initial encounter for closed fracture: Secondary | ICD-10-CM

## 2017-06-13 DIAGNOSIS — M25531 Pain in right wrist: Secondary | ICD-10-CM

## 2017-06-13 MED ORDER — MUPIROCIN 2 % EX OINT: 1.0000 "application " | TOPICAL_OINTMENT | Freq: Every day | CUTANEOUS | 0 refills | Status: DC

## 2017-06-13 MED ORDER — DOXYCYCLINE HYCLATE 100 MG PO TABS: 100.0000 mg | ORAL_TABLET | Freq: Two times a day (BID) | ORAL | 0 refills | Status: AC
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
## 2017-06-26 ENCOUNTER — Ambulatory Visit (INDEPENDENT_AMBULATORY_CARE_PROVIDER_SITE_OTHER): Payer: Self-pay | Admitting: Orthopaedic Surgery

## 2017-06-26 DIAGNOSIS — S52531A Colles' fracture of right radius, initial encounter for closed fracture: Secondary | ICD-10-CM

## 2017-06-26 NOTE — Progress Notes (Signed)
Patient is following up for wound check of his right wrist incision. He is now over 3 months out from open reduction/internal fixation of a distal radius fracture. He presented to the office about 11 days ago with a small wound and likely a small suture abscess. He is already back to work and is doing well. Is been on doxycycline.  On examination his wrist shows improved motion overall. There is no redness. There is no drainage. There is no evidence of active infection.  He was given reassurance that he is doing well and he agrees with this. He'll finish the two-week course of doxycycline. All questions and concerns were answered and addressed. At this point, as needed. Stork on aggressive wrist range of motion.

## 2017-12-04 ENCOUNTER — Ambulatory Visit (INDEPENDENT_AMBULATORY_CARE_PROVIDER_SITE_OTHER): Payer: Self-pay | Admitting: Orthopaedic Surgery

## 2017-12-04 ENCOUNTER — Encounter (INDEPENDENT_AMBULATORY_CARE_PROVIDER_SITE_OTHER): Payer: Self-pay | Admitting: Orthopaedic Surgery

## 2017-12-04 DIAGNOSIS — M71031 Abscess of bursa, right wrist: Secondary | ICD-10-CM | POA: Insufficient documentation

## 2017-12-04 NOTE — Progress Notes (Signed)
Office Visit Note   Patient: Luke Boone           Date of Birth: Jan 28, 1986           MRN: [id number] Visit Date: 12/04/2017              Requested by: No referring provider defined for this encounter. PCP: Patient, No Pcp Per   Assessment & Plan: Visit Diagnoses:  1. Abscess of bursa, right wrist     Plan: At this point he needs a formal open irrigation and debridement of the right wrist with removal of the retained orthopedic hardware in an effort to eradicate any infection.  I am not going to start antibiotics right now so we can obtain intraoperative cultures.  His mother is with him today.  They fully understand the reasoning behind proceeding with surgery at this point as well as the risk and benefits involved.  We will set surgery up for this week.  We would then see him back in 2 weeks postoperative with 2 views of the right wrist.  We will then hopefully determine how long the antibiotics need to be utilized to treat this infection.  Follow-Up Instructions: Return for 2 weeks post-op.   Orders:  No orders of the defined types were placed in this encounter.  No orders of the defined types were placed in this encounter.     Procedures: No procedures performed   Clinical Data: No additional findings.   Subjective: Chief Complaint  Patient presents with  . Right Wrist - Wound Check  The patient comes in today 9 months status post open reduction and fixation of a complex right distal radius fracture.  He has had some drainage from his wrist that is purulent.  He has had a little bit of redness and pain as well.  He had something similar right after surgery only treated with antibiotics and did well.  He eventually healed the fracture.  He denies any systemic illnesses.  He does report right wrist pain.  HPI  Review of Systems He currently denies any fever, chills, nausea, vomiting.  He denies any headache, shortness of breath, chest pain he is alert and oriented  x3 and in no acute distress  Objective: Vital Signs: There were no vitals taken for this visit.  Physical Exam Is alert and oriented x3 in no acute distress Ortho Exam Examination of his right wrist does show some slight swelling.  There is a small area that I can express some purulent material from.  The incision itself was closed.  His hand is well-perfused and neurovascularly intact.   Specialty Comments:  No specialty comments available.  Imaging: No results found.   PMFS History: Patient Active Problem List   Diagnosis Date Noted  . Abscess of bursa, right wrist 12/04/2017  . Closed displaced fracture of proximal phalanx of left index finger 03/08/2017  . Colles' fracture of right radius, initial encounter for closed fracture 03/08/2017   Past Medical History:  Diagnosis Date  . Displaced fracture of distal phalanx of left index finger, initial encounter for closed fracture    03-07-17  . Radius fracture 03/07/2017   RIGHT  . Rash    BACK RED RASH NO DRAINAGE CHIN STITCHES, LEFT ARM RED RASH, CHANGING LEFT ARM DRESSING Q DAY WITH BACATRACIN OINTMENT SMALL AMOUNT YELLOW DRAINAGE  . Sciatica    RIGHT LEG, BULGING DISC L5 TO S 1    History reviewed. No pertinent family history.  Past Surgical History:  Procedure Laterality Date  . OPEN REDUCTION INTERNAL FIXATION (ORIF) DISTAL RADIAL FRACTURE Right 03/16/2017   Procedure: OPEN REDUCTION INTERNAL FIXATION (ORIF) RIGHT DISTAL RADIUS FRACTURE;  Surgeon: Kathryne HitchBlackman, Meryle Pugmire Y, MD;  Location: [facility];  Service: Orthopedics;  Laterality: Right;   Social History   Occupational History  . Not on file  Tobacco Use  . Smoking status: Current Every Day Smoker    Packs/day: 1.00    Types: Cigarettes  . Smokeless tobacco: Former Engineer, waterUser  Substance and Sexual Activity  . Alcohol use: Yes    Alcohol/week: 3.6 oz    Types: 6 Cans of beer per week    Comment: Weekends.   . Drug use: No  . Sexual activity: Not on file

## 2017-12-05 NOTE — Progress Notes (Signed)
Preop on 3/28 at 0900am.  Surgery on 12/07/2017.  Need orders in epic

## 2017-12-05 NOTE — Patient Instructions (Signed)
Luke BridgeCory D Boone  12/05/2017   Your procedure is scheduled on: 12/07/2017   [facility]  Report to admitting at    1430 pm   Call this number if you have problems the morning of surgery [phone]   Remember: Do not eat food  :After Midnight. May have clear liquids from 12 midnite until 1100am morning of surgery then nothing by mouth.      Take these medicines the morning of surgery with A SIP OF WATER: none                                 You may not have any metal on your body including hair pins and              piercings  Do not wear jewelry, , lotions, powders or perfumes, deodorant              Men may shave face and neck.   Do not bring valuables to the hospital. [facility] IS NOT             RESPONSIBLE   FOR VALUABLES.  Contacts, dentures or bridgework may not be worn into surgery.  Leave suitcase in the car. After surgery it may be brought to your room.        CLEAR LIQUID DIET   Foods Allowed                                                                     Foods Excluded  Coffee and tea, regular and decaf                             liquids that you cannot  Plain Jell-O in any flavor                                             see through such as: Fruit ices (not with fruit pulp)                                     milk, soups, orange juice  Iced Popsicles                                    All solid food Carbonated beverages, regular and diet                                    Cranberry, grape and apple juices Sports drinks like Gatorade Lightly seasoned clear broth or consume(fat free) Sugar, honey syrup  Sample Menu Breakfast  Lunch                                     Supper Cranberry juice                    Beef broth                            Chicken broth Jell-O                                     Grape juice                           Apple juice Coffee or tea                         Jell-O                                      Popsicle                                                Coffee or tea                        Coffee or tea  _____________________________________________________________________                Please read over the following fact sheets you were given: _____________________________________________________________________             [facility] - Preparing for Surgery Before surgery, you can play an important role.  Because skin is not sterile, your skin needs to be as free of germs as possible.  You can reduce the number of germs on your skin by washing with CHG (chlorahexidine gluconate) soap before surgery.  CHG is an antiseptic cleaner which kills germs and bonds with the skin to continue killing germs even after washing. Please DO NOT use if you have an allergy to CHG or antibacterial soaps.  If your skin becomes reddened/irritated stop using the CHG and inform your nurse when you arrive at Short Stay. Do not shave (including legs and underarms) for at least 48 hours prior to the first CHG shower.  You may shave your face/neck. Please follow these instructions carefully:  1.  Shower with CHG Soap the night before surgery and the  morning of Surgery.  2.  If you choose to wash your hair, wash your hair first as usual with your  normal  shampoo.  3.  After you shampoo, rinse your hair and body thoroughly to remove the  shampoo.                           4.  Use CHG as you would any other liquid soap.  You can apply chg directly  to the skin and wash  Gently with a scrungie or clean washcloth.  5.  Apply the CHG Soap to your body ONLY FROM THE NECK DOWN.   Do not use on face/ open                           Wound or open sores. Avoid contact with eyes, ears mouth and genitals (private parts).                       Wash face,  Genitals (private parts) with your normal soap.             6.  Wash thoroughly, paying  special attention to the area where your surgery  will be performed.  7.  Thoroughly rinse your body with warm water from the neck down.  8.  DO NOT shower/wash with your normal soap after using and rinsing off  the CHG Soap.                9.  Pat yourself dry with a clean towel.            10.  Wear clean pajamas.            11.  Place clean sheets on your bed the night of your first shower and do not  sleep with pets. Day of Surgery : Do not apply any lotions/deodorants the morning of surgery.  Please wear clean clothes to the hospital/surgery center.  FAILURE TO FOLLOW THESE INSTRUCTIONS MAY RESULT IN THE CANCELLATION OF YOUR SURGERY PATIENT SIGNATURE_________________________________  NURSE SIGNATURE__________________________________  ________________________________________________________________________

## 2017-12-06 ENCOUNTER — Encounter (HOSPITAL_COMMUNITY): Payer: Self-pay

## 2017-12-06 ENCOUNTER — Encounter (HOSPITAL_COMMUNITY)
Admission: RE | Admit: 2017-12-06 | Discharge: 2017-12-06 | Disposition: A | Discharge status: Home / Self Care | Payer: Self-pay | Source: Ambulatory Visit | Attending: Orthopaedic Surgery | Admitting: Orthopaedic Surgery

## 2017-12-06 ENCOUNTER — Other Ambulatory Visit (INDEPENDENT_AMBULATORY_CARE_PROVIDER_SITE_OTHER): Payer: Self-pay | Admitting: Physician Assistant

## 2017-12-06 ENCOUNTER — Other Ambulatory Visit: Payer: Self-pay

## 2017-12-06 DIAGNOSIS — Z01818 Encounter for other preprocedural examination: Secondary | ICD-10-CM | POA: Insufficient documentation

## 2017-12-06 DIAGNOSIS — M71031 Abscess of bursa, right wrist: Secondary | ICD-10-CM | POA: Insufficient documentation

## 2017-12-06 LAB — CBC
HCT: 43.3 % (ref 39.0–52.0)
Hemoglobin: 13.9 g/dL (ref 13.0–17.0)
MCH: 30.1 pg (ref 26.0–34.0)
MCHC: 32.1 g/dL (ref 30.0–36.0)
MCV: 93.7 fL (ref 78.0–100.0)
Platelets: 272 10*3/uL (ref 150–400)
RBC: 4.62 MIL/uL (ref 4.22–5.81)
RDW: 13.5 % (ref 11.5–15.5)
WBC: 10.9 10*3/uL — ABNORMAL HIGH (ref 4.0–10.5)

## 2017-12-07 ENCOUNTER — Ambulatory Visit (HOSPITAL_COMMUNITY): Payer: Self-pay | Admitting: Registered Nurse

## 2017-12-07 ENCOUNTER — Encounter (HOSPITAL_COMMUNITY)
Admission: RE | Disposition: A | Discharge status: Home / Self Care | Payer: Self-pay | Source: Ambulatory Visit | Attending: Orthopaedic Surgery

## 2017-12-07 ENCOUNTER — Observation Stay (HOSPITAL_COMMUNITY)
Admission: RE | Admit: 2017-12-07 | Discharge: 2017-12-08 | Disposition: A | Discharge status: Home / Self Care | Payer: Self-pay | Source: Ambulatory Visit | Attending: Orthopaedic Surgery | Admitting: Orthopaedic Surgery

## 2017-12-07 ENCOUNTER — Other Ambulatory Visit: Payer: Self-pay

## 2017-12-07 ENCOUNTER — Encounter (HOSPITAL_COMMUNITY): Payer: Self-pay

## 2017-12-07 DIAGNOSIS — Z7982 Long term (current) use of aspirin: Secondary | ICD-10-CM | POA: Insufficient documentation

## 2017-12-07 DIAGNOSIS — Y793 Surgical instruments, materials and orthopedic devices (including sutures) associated with adverse incidents: Secondary | ICD-10-CM | POA: Insufficient documentation

## 2017-12-07 DIAGNOSIS — Y929 Unspecified place or not applicable: Secondary | ICD-10-CM | POA: Insufficient documentation

## 2017-12-07 DIAGNOSIS — M71031 Abscess of bursa, right wrist: Secondary | ICD-10-CM | POA: Diagnosis present

## 2017-12-07 DIAGNOSIS — T84612A Infection and inflammatory reaction due to internal fixation device of right radius, initial encounter: Principal | ICD-10-CM | POA: Insufficient documentation

## 2017-12-07 DIAGNOSIS — M01X31 Direct infection of right wrist in infectious and parasitic diseases classified elsewhere: Secondary | ICD-10-CM

## 2017-12-07 DIAGNOSIS — F17210 Nicotine dependence, cigarettes, uncomplicated: Secondary | ICD-10-CM | POA: Insufficient documentation

## 2017-12-07 DIAGNOSIS — M009 Pyogenic arthritis, unspecified: Secondary | ICD-10-CM | POA: Diagnosis present

## 2017-12-07 DIAGNOSIS — T8484XA Pain due to internal orthopedic prosthetic devices, implants and grafts, initial encounter: Secondary | ICD-10-CM

## 2017-12-07 HISTORY — PX: I & D EXTREMITY: SHX5045

## 2017-12-07 HISTORY — PX: HARDWARE REMOVAL: SHX979

## 2017-12-07 SURGERY — REMOVAL, HARDWARE
Anesthesia: General | Site: Wrist | Laterality: Right

## 2017-12-07 MED ORDER — CHLORHEXIDINE GLUCONATE 4 % EX LIQD: 60.0000 mL | Freq: Once | CUTANEOUS | Status: DC

## 2017-12-07 MED ORDER — MIDAZOLAM HCL 5 MG/5ML IJ SOLN
INTRAMUSCULAR | Status: DC | PRN
  Administered 2017-12-07: 2 mg via INTRAVENOUS

## 2017-12-07 MED ORDER — CEFAZOLIN SODIUM-DEXTROSE 2-4 GM/100ML-% IV SOLN
INTRAVENOUS | Status: AC
  Filled 2017-12-07: qty 100

## 2017-12-07 MED ORDER — FENTANYL CITRATE (PF) 250 MCG/5ML IJ SOLN
INTRAMUSCULAR | Status: AC
  Filled 2017-12-07: qty 5

## 2017-12-07 MED ORDER — OXYCODONE HCL 5 MG PO TABS
5.0000 mg | ORAL_TABLET | ORAL | Status: DC | PRN
  Administered 2017-12-07 (×2): 5 mg via ORAL
  Filled 2017-12-07 (×2): qty 1

## 2017-12-07 MED ORDER — METHOCARBAMOL 500 MG PO TABS: 500.0000 mg | ORAL_TABLET | Freq: Four times a day (QID) | ORAL | Status: DC | PRN

## 2017-12-07 MED ORDER — CLINDAMYCIN PHOSPHATE 600 MG/50ML IV SOLN
600.0000 mg | Freq: Four times a day (QID) | INTRAVENOUS | Status: AC
  Administered 2017-12-07 – 2017-12-08 (×3): 600 mg via INTRAVENOUS
  Filled 2017-12-07 (×3): qty 50

## 2017-12-07 MED ORDER — ONDANSETRON HCL 4 MG/2ML IJ SOLN
INTRAMUSCULAR | Status: DC | PRN
  Administered 2017-12-07: 4 mg via INTRAVENOUS

## 2017-12-07 MED ORDER — ONDANSETRON HCL 4 MG/2ML IJ SOLN
INTRAMUSCULAR | Status: AC
  Filled 2017-12-07: qty 2

## 2017-12-07 MED ORDER — MIDAZOLAM HCL 2 MG/2ML IJ SOLN
INTRAMUSCULAR | Status: AC
  Filled 2017-12-07: qty 2

## 2017-12-07 MED ORDER — LIDOCAINE 2% (20 MG/ML) 5 ML SYRINGE
INTRAMUSCULAR | Status: AC
  Filled 2017-12-07: qty 5

## 2017-12-07 MED ORDER — MIDAZOLAM HCL 2 MG/2ML IJ SOLN
0.5000 mg | Freq: Once | INTRAMUSCULAR | Status: AC | PRN
  Administered 2017-12-07: 0.5 mg via INTRAVENOUS

## 2017-12-07 MED ORDER — DOCUSATE SODIUM 100 MG PO CAPS
100.0000 mg | ORAL_CAPSULE | Freq: Two times a day (BID) | ORAL | Status: DC
  Administered 2017-12-07 – 2017-12-08 (×2): 100 mg via ORAL
  Filled 2017-12-07 (×2): qty 1

## 2017-12-07 MED ORDER — ONDANSETRON HCL 4 MG PO TABS: 4.0000 mg | ORAL_TABLET | Freq: Four times a day (QID) | ORAL | Status: DC | PRN

## 2017-12-07 MED ORDER — ACETAMINOPHEN 325 MG PO TABS: 325.0000 mg | ORAL_TABLET | Freq: Four times a day (QID) | ORAL | Status: DC | PRN

## 2017-12-07 MED ORDER — LACTATED RINGERS IV SOLN
INTRAVENOUS | Status: DC
  Administered 2017-12-07: 13:00:00 via INTRAVENOUS

## 2017-12-07 MED ORDER — MIDAZOLAM HCL 2 MG/2ML IJ SOLN
INTRAMUSCULAR | Status: AC
Start: 2017-12-07 — End: 2017-12-08
  Filled 2017-12-07: qty 2

## 2017-12-07 MED ORDER — PHENYLEPHRINE 40 MCG/ML (10ML) SYRINGE FOR IV PUSH (FOR BLOOD PRESSURE SUPPORT)
PREFILLED_SYRINGE | INTRAVENOUS | Status: AC
  Filled 2017-12-07: qty 10

## 2017-12-07 MED ORDER — FENTANYL CITRATE (PF) 100 MCG/2ML IJ SOLN
INTRAMUSCULAR | Status: DC | PRN
  Administered 2017-12-07 (×3): 50 ug via INTRAVENOUS
  Administered 2017-12-07: 100 ug via INTRAVENOUS

## 2017-12-07 MED ORDER — HYDROMORPHONE HCL 1 MG/ML IJ SOLN
INTRAMUSCULAR | Status: AC
  Administered 2017-12-07: 0.5 mg
  Filled 2017-12-07: qty 2

## 2017-12-07 MED ORDER — LIDOCAINE 2% (20 MG/ML) 5 ML SYRINGE
INTRAMUSCULAR | Status: DC | PRN
  Administered 2017-12-07: 60 mg via INTRAVENOUS

## 2017-12-07 MED ORDER — PHENYLEPHRINE 40 MCG/ML (10ML) SYRINGE FOR IV PUSH (FOR BLOOD PRESSURE SUPPORT)
PREFILLED_SYRINGE | INTRAVENOUS | Status: DC | PRN
  Administered 2017-12-07: 80 ug via INTRAVENOUS

## 2017-12-07 MED ORDER — HYDROMORPHONE HCL 1 MG/ML IJ SOLN
0.5000 mg | INTRAMUSCULAR | Status: DC | PRN
  Administered 2017-12-07: 1 mg via INTRAVENOUS
  Filled 2017-12-07: qty 1

## 2017-12-07 MED ORDER — HYDROMORPHONE HCL 1 MG/ML IJ SOLN
0.2500 mg | INTRAMUSCULAR | Status: DC | PRN
  Administered 2017-12-07 (×3): 0.5 mg via INTRAVENOUS

## 2017-12-07 MED ORDER — DEXAMETHASONE SODIUM PHOSPHATE 10 MG/ML IJ SOLN
INTRAMUSCULAR | Status: DC | PRN
  Administered 2017-12-07: 10 mg via INTRAVENOUS

## 2017-12-07 MED ORDER — METOCLOPRAMIDE HCL 5 MG/ML IJ SOLN: 5.0000 mg | Freq: Three times a day (TID) | INTRAMUSCULAR | Status: DC | PRN

## 2017-12-07 MED ORDER — MEPERIDINE HCL 50 MG/ML IJ SOLN: 6.2500 mg | INTRAMUSCULAR | Status: DC | PRN

## 2017-12-07 MED ORDER — PROMETHAZINE HCL 25 MG/ML IJ SOLN: 6.2500 mg | INTRAMUSCULAR | Status: DC | PRN

## 2017-12-07 MED ORDER — METHOCARBAMOL 1000 MG/10ML IJ SOLN
500.0000 mg | Freq: Four times a day (QID) | INTRAVENOUS | Status: DC | PRN
  Administered 2017-12-07: 500 mg via INTRAVENOUS
  Filled 2017-12-07: qty 550

## 2017-12-07 MED ORDER — METOCLOPRAMIDE HCL 5 MG PO TABS: 5.0000 mg | ORAL_TABLET | Freq: Three times a day (TID) | ORAL | Status: DC | PRN

## 2017-12-07 MED ORDER — OXYCODONE HCL 5 MG PO TABS
10.0000 mg | ORAL_TABLET | ORAL | Status: DC | PRN
  Administered 2017-12-08 (×2): 15 mg via ORAL
  Filled 2017-12-07 (×2): qty 3

## 2017-12-07 MED ORDER — DIPHENHYDRAMINE HCL 12.5 MG/5ML PO ELIX: 12.5000 mg | ORAL_SOLUTION | ORAL | Status: DC | PRN

## 2017-12-07 MED ORDER — DEXAMETHASONE SODIUM PHOSPHATE 10 MG/ML IJ SOLN
INTRAMUSCULAR | Status: AC
  Filled 2017-12-07: qty 1

## 2017-12-07 MED ORDER — SODIUM CHLORIDE 0.9 % IV SOLN
INTRAVENOUS | Status: DC
  Administered 2017-12-07: 16:00:00 via INTRAVENOUS

## 2017-12-07 MED ORDER — CEFAZOLIN SODIUM-DEXTROSE 2-3 GM-%(50ML) IV SOLR
INTRAVENOUS | Status: DC | PRN
  Administered 2017-12-07: 2 g via INTRAVENOUS

## 2017-12-07 MED ORDER — ONDANSETRON HCL 4 MG/2ML IJ SOLN: 4.0000 mg | Freq: Four times a day (QID) | INTRAMUSCULAR | Status: DC | PRN

## 2017-12-07 MED ORDER — PROPOFOL 10 MG/ML IV BOLUS
INTRAVENOUS | Status: DC | PRN
  Administered 2017-12-07: 150 mg via INTRAVENOUS
  Administered 2017-12-07: 50 mg via INTRAVENOUS

## 2017-12-07 MED ORDER — 0.9 % SODIUM CHLORIDE (POUR BTL) OPTIME
TOPICAL | Status: DC | PRN
  Administered 2017-12-07: 1000 mL

## 2017-12-07 SURGICAL SUPPLY — 44 items
BAG SPEC THK2 15X12 ZIP CLS (MISCELLANEOUS) ×1
BAG ZIPLOCK 12X15 (MISCELLANEOUS) ×3 IMPLANT
BANDAGE ACE 4X5 VEL STRL LF (GAUZE/BANDAGES/DRESSINGS) ×2 IMPLANT
COVER SURGICAL LIGHT HANDLE (MISCELLANEOUS) ×3 IMPLANT
CUFF TOURN SGL QUICK 24 (TOURNIQUET CUFF) ×3
CUFF TRNQT CYL 24X4X40X1 (TOURNIQUET CUFF) IMPLANT
DRAPE POUCH INSTRU U-SHP 10X18 (DRAPES) ×3 IMPLANT
DRAPE STERI IOBAN 125X83 (DRAPES) ×3 IMPLANT
DRAPE SURG 17X11 SM STRL (DRAPES) ×2 IMPLANT
DRAPE U-SHAPE 47X51 STRL (DRAPES) ×3 IMPLANT
DRSG PAD ABDOMINAL 8X10 ST (GAUZE/BANDAGES/DRESSINGS) ×3 IMPLANT
ELECT REM PT RETURN 15FT ADLT (MISCELLANEOUS) ×3 IMPLANT
GAUZE SPONGE 4X4 12PLY STRL (GAUZE/BANDAGES/DRESSINGS) ×3 IMPLANT
GAUZE XEROFORM 1X8 LF (GAUZE/BANDAGES/DRESSINGS) ×2 IMPLANT
GLOVE BIO SURGEON STRL SZ7.5 (GLOVE) ×3 IMPLANT
GLOVE BIOGEL PI IND STRL 7.0 (GLOVE) IMPLANT
GLOVE BIOGEL PI IND STRL 7.5 (GLOVE) IMPLANT
GLOVE BIOGEL PI IND STRL 8 (GLOVE) ×2 IMPLANT
GLOVE BIOGEL PI INDICATOR 7.0 (GLOVE) ×2
GLOVE BIOGEL PI INDICATOR 7.5 (GLOVE) ×8
GLOVE BIOGEL PI INDICATOR 8 (GLOVE) ×4
GLOVE ECLIPSE 7.0 STRL STRAW (GLOVE) ×2 IMPLANT
GLOVE ECLIPSE 8.0 STRL XLNG CF (GLOVE) ×3 IMPLANT
GLOVE ORTHO TXT STRL SZ7.5 (GLOVE) ×2 IMPLANT
GLOVE SURG SS PI 7.0 STRL IVOR (GLOVE) ×2 IMPLANT
GOWN SPEC L3 XXLG W/TWL (GOWN DISPOSABLE) ×2 IMPLANT
GOWN STRL REUS W/TWL XL LVL3 (GOWN DISPOSABLE) ×9 IMPLANT
KIT BASIN OR (CUSTOM PROCEDURE TRAY) ×3 IMPLANT
PACK TOTAL JOINT (CUSTOM PROCEDURE TRAY) ×3 IMPLANT
PAD CAST 4YDX4 CTTN HI CHSV (CAST SUPPLIES) IMPLANT
PADDING CAST COTTON 4X4 STRL (CAST SUPPLIES) ×3
POSITIONER SURGICAL ARM (MISCELLANEOUS) ×3 IMPLANT
STAPLER VISISTAT 35W (STAPLE) IMPLANT
SUCTION FRAZIER HANDLE 10FR (MISCELLANEOUS) ×2
SUCTION TUBE FRAZIER 10FR DISP (MISCELLANEOUS) IMPLANT
SUT ETHILON 2 0 PS N (SUTURE) ×4 IMPLANT
SUT MNCRL AB 4-0 PS2 18 (SUTURE) ×2 IMPLANT
SUT VIC AB 1 CT1 27 (SUTURE)
SUT VIC AB 1 CT1 27XBRD ANTBC (SUTURE) ×1 IMPLANT
SUT VIC AB 2-0 CT1 27 (SUTURE)
SUT VIC AB 2-0 CT1 TAPERPNT 27 (SUTURE) ×1 IMPLANT
SUT VIC AB 2-0 CT2 27 (SUTURE) ×2 IMPLANT
SWAB COLLECTION DEVICE MRSA (MISCELLANEOUS) ×2 IMPLANT
TOWEL OR 17X26 10 PK STRL BLUE (TOWEL DISPOSABLE) ×6 IMPLANT

## 2017-12-07 NOTE — Op Note (Deleted)
  The note originally documented on this encounter has been moved the the encounter in which it belongs.  

## 2017-12-07 NOTE — Brief Op Note (Signed)
12/07/2017  3:02 PM  PATIENT:  Luke Boone  32 y.o. male  PRE-OPERATIVE DIAGNOSIS:  right wrist infection  POST-OPERATIVE DIAGNOSIS:  right wrist infection  PROCEDURE:  Procedure(s): HARDWARE REMOVAL RIGHT WRIST (Right) IRRIGATION AND DEBRIDEMENT RIGHT WRIST (Right)  SURGEON:  Surgeon(s) and Role:    Kathryne Hitch* Samera Macy Y, MD - Primary  PHYSICIAN ASSISTANT: Rexene EdisonGil Clark, PA-C  ANESTHESIA:   general  COUNTS:  YES  TOURNIQUET:   Total Tourniquet Time Documented: Upper Arm (Right) - 22 minutes Total: Upper Arm (Right) - 22 minutes   DICTATION: .Other Dictation: Dictation Number [id number]  PLAN OF CARE: Admit for overnight observation  PATIENT DISPOSITION:  PACU - hemodynamically stable.   Delay start of Pharmacological VTE agent (>24hrs) due to surgical blood loss or risk of bleeding: no

## 2017-12-07 NOTE — Anesthesia Postprocedure Evaluation (Signed)
Anesthesia Post Note  Patient: Luke Boone  Procedure(s) Performed: HARDWARE REMOVAL RIGHT WRIST (Right Wrist) IRRIGATION AND DEBRIDEMENT RIGHT WRIST (Right Wrist)     Patient location during evaluation: PACU Anesthesia Type: General Level of consciousness: awake and alert, oriented and patient cooperative Pain management: pain level controlled Vital Signs Assessment: post-procedure vital signs reviewed and stable Respiratory status: spontaneous breathing, nonlabored ventilation and respiratory function stable Cardiovascular status: blood pressure returned to baseline and stable Postop Assessment: no apparent nausea or vomiting Anesthetic complications: no    Last Vitals:  Vitals:   12/07/17 1600 12/07/17 1615  BP: (!) 135/98 (!) 139/104  Pulse: 80 79  Resp: 16 12  Temp:  36.6 C  SpO2: 100% 100%    Last Pain:  Vitals:   12/07/17 1615  TempSrc:   PainSc: 6                  Malana Eberwein,E. Berdina Cheever

## 2017-12-07 NOTE — Anesthesia Preprocedure Evaluation (Addendum)
Anesthesia Evaluation  Patient identified by MRN, date of birth, ID band Patient awake    Reviewed: Allergy & Precautions, NPO status , Patient's Chart, lab work & pertinent test results  History of Anesthesia Complications Negative for: history of anesthetic complications  Airway Mallampati: II  TM Distance: >3 FB Neck ROM: Full    Dental  (+) Dental Advisory Given, Poor Dentition   Pulmonary Current Smoker,    breath sounds clear to auscultation       Cardiovascular negative cardio ROS   Rhythm:Regular Rate:Normal     Neuro/Psych negative neurological ROS     GI/Hepatic negative GI ROS, Neg liver ROS,   Endo/Other  negative endocrine ROS  Renal/GU negative Renal ROS     Musculoskeletal   Abdominal   Peds  Hematology negative hematology ROS (+)   Anesthesia Other Findings   Reproductive/Obstetrics                            Anesthesia Physical Anesthesia Plan  ASA: II  Anesthesia Plan: General   Post-op Pain Management:    Induction: Intravenous  PONV Risk Score and Plan: 2 and Ondansetron and Dexamethasone  Airway Management Planned: LMA  Additional Equipment:   Intra-op Plan:   Post-operative Plan:   Informed Consent: I have reviewed the patients History and Physical, chart, labs and discussed the procedure including the risks, benefits and alternatives for the proposed anesthesia with the patient or authorized representative who has indicated his/her understanding and acceptance.   Dental advisory given  Plan Discussed with: CRNA and Surgeon  Anesthesia Plan Comments: (Plan routine monitors, GA- LMA OK)       Anesthesia Quick Evaluation
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
## 2017-12-07 NOTE — Plan of Care (Signed)
Plan of care 
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
## 2017-12-07 NOTE — Transfer of Care (Signed)
Immediate Anesthesia Transfer of Care Note  Patient: Luke BridgeCory D Boney  Procedure(s) Performed: HARDWARE REMOVAL RIGHT WRIST (Right Wrist) IRRIGATION AND DEBRIDEMENT RIGHT WRIST (Right Wrist)  Patient Location: PACU  Anesthesia Type:General  Level of Consciousness: awake, alert  and oriented  Airway & Oxygen Therapy: Patient Spontanous Breathing and Patient connected to face mask oxygen  Post-op Assessment: Report given to RN and Post -op Vital signs reviewed and stable  Post vital signs: Reviewed and stable  Last Vitals:  Vitals Value Taken Time  BP    Temp    Pulse    Resp    SpO2      Last Pain:  Vitals:   12/07/17 1312  TempSrc:   PainSc: 0-No pain         Complications: No apparent anesthesia complications

## 2017-12-07 NOTE — Anesthesia Procedure Notes (Signed)
Procedure Name: LMA Insertion Date/Time: 12/07/2017 2:08 PM Performed by: Jhonnie GarnerMarshall, Kaemon Barnett M, CRNA Pre-anesthesia Checklist: Patient identified, Emergency Drugs available, Suction available and Patient being monitored Patient Re-evaluated:Patient Re-evaluated prior to induction Oxygen Delivery Method: Circle system utilized Preoxygenation: Pre-oxygenation with 100% oxygen Induction Type: IV induction Ventilation: Mask ventilation without difficulty LMA: LMA inserted LMA Size: 4.0 Number of attempts: 1 Placement Confirmation: positive ETCO2 and breath sounds checked- equal and bilateral Tube secured with: Tape Dental Injury: Teeth and Oropharynx as per pre-operative assessment

## 2017-12-08 ENCOUNTER — Encounter (HOSPITAL_COMMUNITY): Payer: Self-pay | Admitting: Orthopaedic Surgery

## 2017-12-08 MED ORDER — OXYCODONE HCL 5 MG PO TABS: 5.0000 mg | ORAL_TABLET | ORAL | 0 refills | Status: AC | PRN

## 2017-12-08 MED ORDER — DOXYCYCLINE HYCLATE 100 MG PO TABS: 100.0000 mg | ORAL_TABLET | Freq: Two times a day (BID) | ORAL | 0 refills | Status: DC

## 2017-12-08 NOTE — Discharge Instructions (Signed)
Dry dressing daily to right wrist incisions. You can get your incision wet in 5 days. Antibiotic ointment as needed.

## 2017-12-08 NOTE — Discharge Summary (Signed)
Patient ID: Luke Boone MRN: [id number] DOB/AGE: 11/27/1985 32 y.o.  Admit date: 12/07/2017 Discharge date: 12/08/2017  Admission Diagnoses:  Principal Problem:   Abscess of bursa, right wrist Active Problems:   Infection of right wrist [facility])   Discharge Diagnoses:  Same  Past Medical History:  Diagnosis Date  . Displaced fracture of distal phalanx of left index finger, initial encounter for closed fracture    03-07-17  . Radius fracture 03/07/2017   RIGHT  . Rash    BACK RED RASH NO DRAINAGE CHIN STITCHES, LEFT ARM RED RASH, CHANGING LEFT ARM DRESSING Q DAY WITH BACATRACIN OINTMENT SMALL AMOUNT YELLOW DRAINAGE  . Sciatica    RIGHT LEG, BULGING DISC L5 TO S 1    Surgeries: Procedure(s): HARDWARE REMOVAL RIGHT WRIST IRRIGATION AND DEBRIDEMENT RIGHT WRIST on 12/07/2017   Consultants:   Discharged Condition: Improved  Hospital Course: Luke Boone is an 32 y.o. male who was admitted 12/07/2017 for operative treatment ofAbscess of bursa, right wrist. Patient has severe unremitting pain that affects sleep, daily activities, and work/hobbies. After pre-op clearance the patient was taken to the operating room on 12/07/2017 and underwent  Procedure(s): HARDWARE REMOVAL RIGHT WRIST IRRIGATION AND DEBRIDEMENT RIGHT WRIST.    Patient was given perioperative antibiotics:  Anti-infectives (From admission, onward)   Start     Dose/Rate Route Frequency Ordered Stop   12/08/17 0000  doxycycline (VIBRA-TABS) 100 MG tablet     100 mg Oral 2 times daily 12/08/17 1025     12/07/17 2000  clindamycin (CLEOCIN) IVPB 600 mg     600 mg 100 mL/hr over 30 Minutes Intravenous Every 6 hours 12/07/17 1639 12/08/17 0905   12/07/17 1343  ceFAZolin (ANCEF) 2-4 GM/100ML-% IVPB    Note to Pharmacy:  Doreene Burke   : cabinet override      12/07/17 1343 12/08/17 0159       Patient was given sequential compression devices, early ambulation, and chemoprophylaxis to prevent DVT.  Patient benefited  maximally from hospital stay and there were no complications.    Recent vital signs:  Patient Vitals for the past 24 hrs:  BP Temp Temp src Pulse Resp SpO2 Height Weight  12/08/17 0600 127/82 98.4 F (36.9 C) Oral 67 16 100 % - -  12/08/17 0139 131/84 98.2 F (36.8 C) Oral 86 16 100 % - -  12/07/17 2122 122/77 98.4 F (36.9 C) Oral 85 16 99 % - -  12/07/17 1945 132/84 98.6 F (37 C) Oral 70 16 100 % - -  12/07/17 1844 120/76 98.6 F (37 C) Oral 86 14 100 % - -  12/07/17 1747 124/80 98.6 F (37 C) Oral 78 14 100 % - -  12/07/17 1645 (!) 139/94 98 F (36.7 C) Oral 71 14 100 %  (1.753 m) 138 lb (62.6 kg)  12/07/17 1633 (!) 139/94 98 F (36.7 C) - 71 12 100 % - -  12/07/17 1615 (!) 139/104 97.8 F (36.6 C) - 79 12 100 % - -  12/07/17 1600 (!) 135/98 - - 80 16 100 % - -  12/07/17 1545 (!) 139/100 - - 83 13 100 % - -  12/07/17 1530 (!) 141/98 - - 88 10 98 % - -  12/07/17 1524 (!) 139/100 98 F (36.7 C) - 88 12 100 % - -  12/07/17 1249 (!) 135/96 98.2 F (36.8 C) Oral 84 16 100 % - -  12/07/17 1200 - - - - - -   (1.753 m) 138 lb (62.6 kg)     Recent laboratory studies:  Recent Labs    12/06/17 1001  WBC 10.9*  HGB 13.9  HCT 43.3  PLT 272     Discharge Medications:   Allergies as of 12/08/2017   No Known Allergies     Medication List    TAKE these medications   doxycycline 100 MG tablet Commonly known as:  VIBRA-TABS Take 1 tablet (100 mg total) by mouth 2 (two) times daily.   GOODYS BODY PAIN PO Take 1 packet by mouth daily as needed (headache or mild pain).   oxyCODONE 5 MG immediate release tablet Commonly known as:  Oxy IR/ROXICODONE Take 1-2 tablets (5-10 mg total) by mouth every 4 (four) hours as needed for moderate pain (pain score 4-6).       Diagnostic Studies: No results found.  Disposition: Discharge disposition: 01-Home or Self Care       Discharge Instructions    Discharge patient   Complete by:  As directed    Discharge  disposition:  01-Home or Self Care   Discharge patient date:  12/08/2017      Follow-up Information    Kathryne Hitch, MD Follow up in 2 week(s).   Specialty:  Orthopedic Surgery Contact information: [address] [place] [address] [phone]            Signed: Kathryne Hitch 12/08/2017, 10:25 AM

## 2017-12-08 NOTE — Progress Notes (Signed)
Patient ID: Luke Boone, male   DOB: December 21, 1985, 32 y.o.   MRN: [id number] Comfortable this am.  Right wrist stable.  Can be discharged to home today.

## 2017-12-08 NOTE — Progress Notes (Signed)
Discharge instructions given to patient and mom. Sharrell Ku Verma Grothaus RN

## 2017-12-08 NOTE — Care Management Note (Addendum)
Case Management Note  Patient Details  Name: Luke Boone MRN: [id number] Date of Birth: Jul 10, 1986  Subjective/Objective:    Infection of right wrist                   Action/Plan: NCM spoke to pt and does not have any insurance at this time. Provided pt with goodrx coupon for medications total would be $25 at [employer]. Pt states he can afford at pharmacy. He has follow up appt for Dr. Magnus IvanBlackman.   Expected Discharge Date:  12/08/17               Expected Discharge Plan:  Home/Self Care  In-House Referral:  NA  Discharge planning Services  CM Consult, Medication Assistance  Post Acute Care Choice:  NA Choice offered to:  NA  DME Arranged:  N/A DME Agency:  NA  HH Arranged:  NA HH Agency:  NA  Status of Service:  Completed, signed off  If discussed at Long Length of Stay Meetings, dates discussed:    Additional Comments:  Elliot CousinShavis, Colin Ellers Ellen, RN 12/08/2017, 10:53 AM

## 2017-12-10 LAB — BODY FLUID CULTURE

## 2017-12-12 LAB — ANAEROBIC CULTURE

## 2017-12-25 ENCOUNTER — Ambulatory Visit (INDEPENDENT_AMBULATORY_CARE_PROVIDER_SITE_OTHER): Payer: Self-pay | Admitting: Orthopaedic Surgery

## 2017-12-25 ENCOUNTER — Ambulatory Visit (INDEPENDENT_AMBULATORY_CARE_PROVIDER_SITE_OTHER): Payer: Self-pay

## 2017-12-25 ENCOUNTER — Encounter (INDEPENDENT_AMBULATORY_CARE_PROVIDER_SITE_OTHER): Payer: Self-pay | Admitting: Orthopaedic Surgery

## 2017-12-25 DIAGNOSIS — M71031 Abscess of bursa, right wrist: Secondary | ICD-10-CM

## 2017-12-25 MED ORDER — DOXYCYCLINE HYCLATE 100 MG PO TABS: 100.0000 mg | ORAL_TABLET | Freq: Two times a day (BID) | ORAL | 0 refills | Status: AC

## 2017-12-25 NOTE — Progress Notes (Signed)
The patient is now over 2 weeks status post removal of a distal radius plate on the volar aspect of his wrist secondary to infection.  He says he is doing well overall.  On examination I removed all the sutures.  There is no drainage or redness and no significant swelling.  He is moving his wrist much easier.  X-rays show the hardware has been removed from his right wrist with a healed fracture and sclerotic changes.  At this point will continue with doxycycline for 2 more weeks and then he can stop all antibiotics.  All questions and concerns were answered and addressed.  Follow-up as needed however if he develops any issues with that wrist at all would need to see him immediately.  His mother says she understands this as well.

## 2018-01-01 ENCOUNTER — Inpatient Hospital Stay (INDEPENDENT_AMBULATORY_CARE_PROVIDER_SITE_OTHER): Payer: Self-pay | Admitting: Orthopaedic Surgery
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
